# Patient Record
Sex: Female | Born: 1935 | Race: White | Hispanic: No | Marital: Married | Smoking: Former smoker
Health system: Southern US, Community
[De-identification: ages and names within clinical notes are randomized; demographics above are authoritative.]

## PROBLEM LIST (undated history)

## (undated) DIAGNOSIS — I272 Pulmonary hypertension, unspecified: Secondary | ICD-10-CM

## (undated) DIAGNOSIS — E119 Type 2 diabetes mellitus without complications: Secondary | ICD-10-CM

## (undated) DIAGNOSIS — J449 Chronic obstructive pulmonary disease, unspecified: Secondary | ICD-10-CM

## (undated) DIAGNOSIS — N3941 Urge incontinence: Secondary | ICD-10-CM

## (undated) DIAGNOSIS — R1310 Dysphagia, unspecified: Secondary | ICD-10-CM

## (undated) DIAGNOSIS — I480 Paroxysmal atrial fibrillation: Secondary | ICD-10-CM

## (undated) DIAGNOSIS — E785 Hyperlipidemia, unspecified: Secondary | ICD-10-CM

## (undated) DIAGNOSIS — F444 Conversion disorder with motor symptom or deficit: Secondary | ICD-10-CM

## (undated) DIAGNOSIS — G5600 Carpal tunnel syndrome, unspecified upper limb: Secondary | ICD-10-CM

## (undated) DIAGNOSIS — G2581 Restless legs syndrome: Secondary | ICD-10-CM

## (undated) DIAGNOSIS — M5416 Radiculopathy, lumbar region: Secondary | ICD-10-CM

## (undated) DIAGNOSIS — M8580 Other specified disorders of bone density and structure, unspecified site: Secondary | ICD-10-CM

## (undated) DIAGNOSIS — I10 Essential (primary) hypertension: Secondary | ICD-10-CM

## (undated) DIAGNOSIS — E1142 Type 2 diabetes mellitus with diabetic polyneuropathy: Secondary | ICD-10-CM

## (undated) HISTORY — DX: Chronic obstructive pulmonary disease, unspecified: J44.9

## (undated) HISTORY — PX: LUNG SURGERY: SHX703

## (undated) HISTORY — PX: CATARACT EXTRACTION: SUR2

## (undated) HISTORY — DX: Type 2 diabetes mellitus without complications: E11.9

## (undated) HISTORY — PX: CARPAL TUNNEL RELEASE: SHX101

## (undated) HISTORY — PX: FINGER SURGERY: SHX640

## (undated) HISTORY — DX: Restless legs syndrome: G25.81

## (undated) HISTORY — DX: Type 2 diabetes mellitus with diabetic polyneuropathy: E11.42

## (undated) HISTORY — DX: Essential (primary) hypertension: I10

## (undated) HISTORY — DX: Other specified disorders of bone density and structure, unspecified site: M85.80

## (undated) HISTORY — DX: Urge incontinence: N39.41

## (undated) HISTORY — DX: Pulmonary hypertension, unspecified: I27.20

## (undated) HISTORY — PX: HIP SURGERY: SHX245

## (undated) HISTORY — DX: Carpal tunnel syndrome, unspecified upper limb: G56.00

## (undated) HISTORY — DX: Hyperlipidemia, unspecified: E78.5

## (undated) HISTORY — DX: Radiculopathy, lumbar region: M54.16

## (undated) HISTORY — DX: Paroxysmal atrial fibrillation: I48.0

## (undated) HISTORY — DX: Dysphagia, unspecified: R13.10

## (undated) HISTORY — DX: Conversion disorder with motor symptom or deficit: F44.4

---

## 2018-04-01 ENCOUNTER — Encounter: Payer: Self-pay | Admitting: Neurology

## 2018-04-13 ENCOUNTER — Telehealth: Payer: Self-pay | Admitting: Neurology

## 2018-04-13 NOTE — Telephone Encounter (Signed)
Crystal Adams is a New Patient for Dr. Arbutus Leas this Monday. She fell  and broke a place on her foot, so she is in a shoe right now. Her daughter is wanting to know should she reschedule the appointment since she is having a hard time getting around. Please Advise. Thanks

## 2018-04-13 NOTE — Telephone Encounter (Signed)
Dr. Arbutus Leas can evaluate the tremor if they want to keep appointment. If she is in a lot of pain and wants to delay, that is fine as well. Thanks.

## 2018-04-16 ENCOUNTER — Encounter: Payer: Self-pay | Admitting: Neurology

## 2018-04-16 NOTE — Progress Notes (Deleted)
Crystal Adams was seen today in neurologic consultation at the request of Curt Bears, MD.  The consultation is for the evaluation of "tremor."  Records have been reviewed.  Patient symptoms started about ***.  Movements are described as whole body shaking episodes.  There is no associated loss of consciousness.  This could last up to 2 minutes.  She does not fall if it happens.  Several times per week she will have spontaneous arching of her lower back.  Patient has been on primidone.  She started on Cymbalta in 2018.  There was concern for psychogenic etiology.  The patient is a 84 y.o. year old female with a history of ***.  The first symptom began *** years ago.   Neuroimaging has *** previously been performed.  It *** available for my review today.  PREVIOUS MEDICATIONS: ***  ALLERGIES:   Allergies  Allergen Reactions  . Lisinopril Cough and Other (See Comments)    Cough   . Other Rash    HMG Co-A-R Inhibitor HMG Co-A-R Inhibitor statins   . Tramadol Itching    CURRENT MEDICATIONS:  Outpatient Encounter Medications as of 04/20/2018  Medication Sig  . acetaminophen (TYLENOL) 325 MG tablet Take by mouth.  Marland Kitchen albuterol (PROVENTIL HFA;VENTOLIN HFA) 108 (90 Base) MCG/ACT inhaler Inhale into the lungs.  Marland Kitchen amiodarone (PACERONE) 200 MG tablet TAKE 1 TABLET BY MOUTH EVERY DAY  . apixaban (ELIQUIS) 2.5 MG TABS tablet TAKE 1 TABLET BY MOUTH TWICE A DAY  . atorvastatin (LIPITOR) 10 MG tablet TAKE 1 TABLET BY MOUTH EVERY DAY  . diclofenac sodium (VOLTAREN) 1 % GEL Apply topically.  . docusate sodium (COLACE) 100 MG capsule Take by mouth.  . DULoxetine (CYMBALTA) 60 MG capsule TAKE ONE CAPSULE BY MOUTH EVERY DAY  . ezetimibe (ZETIA) 10 MG tablet TAKE 1 TABLET BY MOUTH EVERY DAY  . fenofibrate (TRICOR) 145 MG tablet TAKE ONE TABLET BY MOUTH ONCE DAILY  . fluticasone (FLONASE) 50 MCG/ACT nasal spray 2 sprays by Each Nare route daily as needed.  . furosemide (LASIX) 20 MG tablet Take by  mouth.  . gabapentin (NEURONTIN) 100 MG capsule TAKE 1 CAPSULE BY MOUTH TWICE A DAY  . Insulin Detemir (LEVEMIR FLEXTOUCH) 100 UNIT/ML Pen Inject into the skin.  Marland Kitchen levothyroxine (SYNTHROID, LEVOTHROID) 50 MCG tablet Take 1 tablet daily in the morning on an empty stomach  . loratadine (CLARITIN) 10 MG tablet Take by mouth.  Marland Kitchen LORazepam (ATIVAN) 0.5 MG tablet Take by mouth.  . magnesium oxide (MAG-OX) 400 MG tablet Take by mouth.  . metoprolol tartrate (LOPRESSOR) 25 MG tablet TAKE 1 TABLET BY MOUTH TWICE A DAY  . montelukast (SINGULAIR) 10 MG tablet Take by mouth.  . Multiple Vitamin (MULTI-VITAMINS) TABS Take by mouth.  Marland Kitchen omeprazole (PRILOSEC) 20 MG capsule Take by mouth.  . potassium chloride (K-DUR) 10 MEQ tablet TAKE 2 TABLETS IN THE MORNING AND 1 TABLET IN THE EVENING OR AS DIRECTED  . primidone (MYSOLINE) 50 MG tablet Take by mouth.  Marland Kitchen rOPINIRole (REQUIP) 1 MG tablet Take 1 tablet in the afternoon and take 2 tablets at bedtime  . tiZANidine (ZANAFLEX) 2 MG tablet Take 1-2 tabs twice daily as needed for pain, insomnia.   No facility-administered encounter medications on file as of 04/20/2018.     PAST MEDICAL HISTORY:   Past Medical History:  Diagnosis Date  . Carpal tunnel syndrome   . Chronic obstructive pulmonary disease (COPD) (HCC)   . Diabetic peripheral neuropathy (  HCC)   . Dysphagia   . Hyperlipidemia   . Hypertension   . Lumbar radiculopathy   . Osteopenia   . Paroxysmal atrial fibrillation (HCC)   . Psychogenic tremor   . Pulmonary hypertension (HCC)   . RLS (restless legs syndrome)   . Type 2 diabetes mellitus (HCC)   . Urinary incontinence, urge     PAST SURGICAL HISTORY:  *** The histories are not reviewed yet. Please review them in the "History" navigator section and refresh this SmartLink.  SOCIAL HISTORY:   Social History   Socioeconomic History  . Marital status: Not on file    Spouse name: Not on file  . Number of children: Not on file  . Years of  education: Not on file  . Highest education level: Not on file  Occupational History  . Not on file  Social Needs  . Financial resource strain: Not on file  . Food insecurity:    Worry: Not on file    Inability: Not on file  . Transportation needs:    Medical: Not on file    Non-medical: Not on file  Tobacco Use  . Smoking status: Former Smoker    Last attempt to quit: 05/22/2011    Years since quitting: 6.9  . Smokeless tobacco: Never Used  Substance and Sexual Activity  . Alcohol use: Not on file  . Drug use: Not on file  . Sexual activity: Not on file  Lifestyle  . Physical activity:    Days per week: Not on file    Minutes per session: Not on file  . Stress: Not on file  Relationships  . Social connections:    Talks on phone: Not on file    Gets together: Not on file    Attends religious service: Not on file    Active member of club or organization: Not on file    Attends meetings of clubs or organizations: Not on file    Relationship status: Not on file  . Intimate partner violence:    Fear of current or ex partner: Not on file    Emotionally abused: Not on file    Physically abused: Not on file    Forced sexual activity: Not on file  Other Topics Concern  . Not on file  Social History Narrative  . Not on file    FAMILY HISTORY:   No family status information on file.    ROS:  ROS  PHYSICAL EXAMINATION:    VITALS:  There were no vitals filed for this visit.  GEN:  Normal appears female in no acute distress.  Appears stated age. HEENT:  Normocephalic, atraumatic. The mucous membranes are moist. The superficial temporal arteries are without ropiness or tenderness. Cardiovascular: Regular rate and rhythm. Lungs: Clear to auscultation bilaterally. Neck/Heme: There are no carotid bruits noted bilaterally.  NEUROLOGICAL: Orientation:  The patient is alert and oriented x 3.  Fund of knowledge is appropriate.  Recent and remote memory intact.  Attention span  and concentration normal.  Repeats and names without difficulty. Cranial nerves: There is good facial symmetry. The pupils are equal round and reactive to light bilaterally. Fundoscopic exam reveals clear disc margins bilaterally. Extraocular muscles are intact and visual fields are full to confrontational testing. Speech is fluent and clear. Soft palate rises symmetrically and there is no tongue deviation. Hearing is intact to conversational tone. Tone: Tone is good throughout. Sensation: Sensation is intact to light touch and pinprick throughout (facial, trunk,  extremities). Vibration is intact at the bilateral big toe. There is no extinction with double simultaneous stimulation. There is no sensory dermatomal level identified. Coordination:  The patient has no difficulty with RAM's or FNF bilaterally. Motor: Strength is 5/5 in the bilateral upper and lower extremities.  Shoulder shrug is equal and symmetric. There is no pronator drift.  There are no fasciculations noted. DTR's: Deep tendon reflexes are 2/4 at the bilateral biceps, triceps, brachioradialis, patella and achilles.  Plantar responses are downgoing bilaterally. Gait and Station: The patient is able to ambulate without difficulty. The patient is able to heel toe walk without any difficulty. The patient is able to ambulate in a tandem fashion. The patient is able to stand in the Romberg position. Abnormal movements:  ***   IMPRESSION/PLAN  1. ***   -pt also has an evaluation scheduled with Dr. Arlana Pouch at [facility] in 11/2018.    Cc:  No primary care provider on file.

## 2018-04-20 ENCOUNTER — Ambulatory Visit: Payer: Self-pay | Admitting: Neurology

## 2018-05-11 NOTE — Progress Notes (Signed)
Crystal Adams was seen today in neurologic consultation at the request of Curt Bears, MD.  The consultation is for the evaluation of "tremor."  Records have been reviewed.  Patient symptoms started about 2-3 years ago but daughter thinks even longer.  Movements are described as whole body shaking episodes.  It can happen when seated, laying down or standing.  If standing, "I have to hold onto something."  She doesn't know what brings it on but she can feel a "little shake" before the "whole body shake" comes on.  It can last all day but if you "pinch me" it will stop.  Her daughter states that works most of the time.  The PT told her that would "trick her brain."   The PT also told her he could not help her any longer.   There is no associated loss of consciousness.  She can speak during a spell.   Several times per week she will have spontaneous arching of her lower back with tremor when she arises.  Patient has been on primidone.  She is also on gabapentin to see if that helps.   There was concern for psychogenic etiology.  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     ALLERGIES:   Allergies  Allergen Reactions  . Lisinopril Cough and Other (See Comments)    Cough   . Other Rash    HMG Co-A-R Inhibitor HMG Co-A-R Inhibitor statins   . Tramadol Itching    CURRENT MEDICATIONS:  Outpatient Encounter Medications as of 05/18/2018  Medication Sig  . acetaminophen (TYLENOL) 325 MG tablet Take by mouth.  Marland Kitchen albuterol (PROVENTIL HFA;VENTOLIN HFA) 108 (90 Base) MCG/ACT inhaler Inhale into the lungs.  Marland Kitchen amiodarone (PACERONE) 200 MG tablet TAKE 1 TABLET BY MOUTH EVERY DAY  . apixaban (ELIQUIS) 2.5 MG TABS tablet TAKE 1 TABLET BY MOUTH TWICE A DAY  . atorvastatin (LIPITOR) 10 MG tablet TAKE 1 TABLET BY MOUTH EVERY DAY  . diclofenac sodium (VOLTAREN) 1  % GEL Apply topically.  . docusate sodium (COLACE) 100 MG capsule Take by mouth.  . DULoxetine (CYMBALTA) 60 MG capsule TAKE ONE CAPSULE BY MOUTH EVERY DAY  . ezetimibe (ZETIA) 10 MG tablet TAKE 1 TABLET BY MOUTH EVERY DAY  . fenofibrate (TRICOR) 145 MG tablet TAKE ONE TABLET BY MOUTH ONCE DAILY  . fluticasone (FLONASE) 50 MCG/ACT nasal spray 2 sprays by Each Nare route daily as needed.  . furosemide (LASIX) 20 MG tablet Take 20 mg by mouth daily.   Marland Kitchen gabapentin (NEURONTIN) 100 MG capsule 3 at bedtime  . Insulin Detemir (LEVEMIR FLEXTOUCH) 100 UNIT/ML Pen Inject into the skin.  Marland Kitchen levothyroxine (SYNTHROID, LEVOTHROID) 50 MCG tablet Take 1 tablet daily in the morning on an empty stomach  . loratadine (CLARITIN) 10 MG tablet Take by mouth.  Marland Kitchen LORazepam (ATIVAN) 0.5 MG tablet Take by mouth.  . magnesium oxide (MAG-OX) 400 MG tablet Take by mouth.  . metolazone (ZAROXOLYN) 2.5 MG tablet Take 2.5 mg by mouth daily.  . metoprolol tartrate (LOPRESSOR) 25 MG tablet TAKE 1 TABLET BY MOUTH TWICE A DAY  . Multiple Vitamin (MULTI-VITAMINS) TABS Take by mouth.  Marland Kitchen omeprazole (PRILOSEC) 20 MG capsule Take by mouth.  . potassium chloride (K-DUR) 10 MEQ tablet TAKE 2 TABLETS IN THE MORNING AND 1 TABLET  IN THE EVENING OR AS DIRECTED  . primidone (MYSOLINE) 50 MG tablet Take 50 mg by mouth 2 (two) times daily.   Marland Kitchen rOPINIRole (REQUIP) 1 MG tablet Take 1 tablet in the afternoon and take 2 tablets at bedtime  . tiZANidine (ZANAFLEX) 2 MG tablet Take 1-2 tabs twice daily as needed for pain, insomnia.  . [DISCONTINUED] montelukast (SINGULAIR) 10 MG tablet Take by mouth.   No facility-administered encounter medications on file as of 05/18/2018.     PAST MEDICAL HISTORY:   Past Medical History:  Diagnosis Date  . Carpal tunnel syndrome   . Chronic obstructive pulmonary disease (COPD) (HCC)   . Diabetic peripheral neuropathy (HCC)   . Dysphagia   . Hyperlipidemia   . Hypertension   . Lumbar radiculopathy   .  Osteopenia   . Paroxysmal atrial fibrillation (HCC)   . Psychogenic tremor   . Pulmonary hypertension (HCC)   . RLS (restless legs syndrome)   . Type 2 diabetes mellitus (HCC)   . Urinary incontinence, urge     PAST SURGICAL HISTORY:   Past Surgical History:  Procedure Laterality Date  . CARPAL TUNNEL RELEASE Right   . CATARACT EXTRACTION Bilateral   . FINGER SURGERY  left   multiple  . HIP SURGERY Right   . LUNG SURGERY  2019, in her 15's    SOCIAL HISTORY:   Social History   Socioeconomic History  . Marital status: Married    Spouse name: Not on file  . Number of children: Not on file  . Years of education: Not on file  . Highest education level: Not on file  Occupational History  . 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 Financial resource strain: Not on file  . Food insecurity:    Worry: Not on file    Inability: Not on file  . Transportation needs:    Medical: Not on file    Non-medical: Not on file  Tobacco Use  . Smoking status: Former Smoker    Last attempt to quit: 05/22/2011    Years since quitting: 6.9  . Smokeless tobacco: Never Used  Substance and Sexual Activity  . Alcohol use: Not Currently    Frequency: Never  . Drug use: Never  . Sexual activity: Not on file  Lifestyle  . Physical activity:    Days per week: Not on file    Minutes per session: Not on file  . Stress: Not on file  Relationships  . Social connections:    Talks on phone: Not on file    Gets together: Not on file    Attends religious service: Not on file    Active member of club or organization: Not on file    Attends meetings of clubs or organizations: Not on file    Relationship status: Not on file  . Intimate partner violence:    Fear of current or ex partner: Not on file    Emotionally abused: Not on file    Physically abused: Not on file    Forced sexual activity: Not on file  Other Topics Concern  . Not on file   Social History Narrative  . Not on file    FAMILY HISTORY:   Family Status  Relation Name Status  . Mother  Deceased  . Father  Deceased  . Brother x5 Deceased  . Daughter x2 Alive  . Son 1 Alive  ROS:  Review of Systems  Constitutional: Positive for malaise/fatigue.  Eyes: Negative.   Respiratory: Positive for shortness of breath (intermittent, not currently, associated with COPD).   Gastrointestinal: Negative.   Genitourinary: Negative.   Skin: Negative.   Neurological: Positive for weakness.  Endo/Heme/Allergies: Negative.   Psychiatric/Behavioral: Positive for memory loss (per patient; daughter does meds and bill).    PHYSICAL EXAMINATION:    VITALS:   Vitals:   05/18/18 0923  BP: (!) 144/64  Pulse: 64  SpO2: 92%  Weight: 200 lb (90.7 kg)  Height: 5\' 6"  (1.676 m)    GEN:  Normal appears female in no acute distress.  Appears stated age. HEENT:  Normocephalic, atraumatic. The mucous membranes are moist. The superficial temporal arteries are without ropiness or tenderness. Cardiovascular: Regular rate and rhythm. Lungs: Clear to auscultation bilaterally. Neck/Heme: There are no carotid bruits noted bilaterally.  NEUROLOGICAL: Orientation:  The patient is alert and oriented x 3.  Fund of knowledge is appropriate.  Recent and remote memory intact.  Attention span and concentration normal.  Repeats and names without difficulty. Cranial nerves: There is good facial symmetry. The pupils are equal round and reactive to light bilaterally. Fundoscopic exam reveals clear disc margins bilaterally. Extraocular muscles are intact and visual fields are full to confrontational testing. Speech is fluent and clear. Soft palate rises symmetrically and there is no tongue deviation. Hearing is intact to conversational tone. Tone: Tone is good throughout. Sensation: Sensation is intact to light touch and pinprick throughout (facial, trunk, extremities). Vibration is decreased at the  bilateral big toe. There is no extinction with double simultaneous stimulation. There is no sensory dermatomal level identified. Coordination:  The patient has no difficulty with RAM's or FNF bilaterally. Motor: Strength is 5/5 in the bilateral upper and lower extremities (there is some give way weakness in the LLE that improves with encouragement - give way weakness is due to LBP).  Shoulder shrug is equal and symmetric. There is no pronator drift.  There are no fasciculations noted. DTR's: Deep tendon reflexes are 0-1/4 at the bilateral biceps, triceps, brachioradialis, patella and achilles.  Plantar responses are downgoing bilaterally. Abnormal movements:  twice during the visit, the patient begins to have a violent head shake in the no direction and she will stop and pinch herself and then the movements will stop.  2 other times, when standing/walking, she has a high amplitude tremor in the legs and arching of the back associated with back pain.     IMPRESSION/PLAN  1. Psychogenic tremor/ psychogenic seizure  -I was able to witness several episodes during the visit and felt pretty confident that these are psychogenic in nature.  The patient was able to pinch herself and stop the violent head tremor in the "no" direction.  She and her daughter both confirm that this was a typical spell and that pinching herself generally works.  Patient and I discussed nature and pathophysiology of symptoms.  We discussed that this diagnosis does not mean that one "made up" the symptoms, but rather that the symptoms are derived from stress.  Discussed with the patient that his type of movement is not generally effectively treated with medication.  Discussed with the patient that treatment involves counseling to help when understand the source of the stress.  Discussed that it could, and generally does, take quite some time for counseling to help resolve the symptoms.    -I did discuss with them that I would go ahead  and do  an EEG just to make sure that this was not anything else.  I feel confident we can capture a spell.  -pt is currently on primidone and eliquis.  Explained to the patient that these medications interact and one should not be on these medications together.  I would recommend she d/c the primidone.  She is currently on primidone, 50 mg twice per day.  I would decrease to 50 mg once per day for 1 week and then stop the medication.  I do not think that the medication is likely useful anyway.  -Patient's movements were not consistent with orthostatic tremor, which generally is a small amplitude tremor and also would go away with movement of the legs.  In addition, this was not just a lower extremity tremor.  Finally, her movements were but not just when standing, but also seated.  2.   Peripheral neuropathy  -The patient has clinical examination evidence of a diffuse peripheral neuropathy, which is likely due to diabetes.  We discussed safety associated with peripheral neuropathy.  We discussed balance therapy and the importance of ambulatory assistive device for balance assistance.  She is using a walker.  3.  Much greater than 50% of this visit was spent in counseling and coordinating care.  Total face to face time:  60 min     Cc:  Brooke Bonito, MD

## 2018-05-18 ENCOUNTER — Encounter: Payer: Self-pay | Admitting: Neurology

## 2018-05-18 ENCOUNTER — Ambulatory Visit: Payer: Medicare HMO | Admitting: Neurology

## 2018-05-18 VITALS — BP 144/64 | HR 64 | Ht 66.0 in | Wt 200.0 lb

## 2018-05-18 DIAGNOSIS — R251 Tremor, unspecified: Secondary | ICD-10-CM

## 2018-05-18 DIAGNOSIS — E1142 Type 2 diabetes mellitus with diabetic polyneuropathy: Secondary | ICD-10-CM

## 2018-05-18 DIAGNOSIS — F444 Conversion disorder with motor symptom or deficit: Secondary | ICD-10-CM

## 2018-05-18 NOTE — Patient Instructions (Signed)
1. Decrease Primidone to once daily for one week, then stop.   2. Will schedule EEG, if normal we will schedule 48 hour EEG.

## 2018-05-21 ENCOUNTER — Other Ambulatory Visit: Payer: Medicare HMO

## 2018-05-27 ENCOUNTER — Ambulatory Visit (INDEPENDENT_AMBULATORY_CARE_PROVIDER_SITE_OTHER): Payer: Medicare HMO | Admitting: Neurology

## 2018-05-27 DIAGNOSIS — R251 Tremor, unspecified: Secondary | ICD-10-CM | POA: Diagnosis not present

## 2018-05-28 ENCOUNTER — Telehealth: Payer: Self-pay | Admitting: Neurology

## 2018-05-28 NOTE — Telephone Encounter (Signed)
Patients daughter made aware

## 2018-05-28 NOTE — Telephone Encounter (Signed)
-----   Message from Octaviano Battyebecca S Tat, DO sent at 05/28/2018 10:44 AM EST ----- Lesly RubensteinJade, please give patient/daughter results.  When pt was here yesterday she asked results be called to daughter.  Pt had several typical spells that were unaccompanied by changes in brain waves.  As I told them in the visit, I believe that these episodes are associated with stress.  No epilepsy.

## 2018-05-28 NOTE — Procedures (Signed)
TECHNICAL SUMMARY:  A multichannel referential and bipolar montage EEG using the standard international 10-20 system was performed on the patient described as awake and drowsy.  The dominant background activity consists of 9 hertz activity seen most prominantly over the posterior head region.  The backgound activity is reactive to eye opening and closing procedures.  Low voltage fast (beta) activity is distributed symmetrically and maximally over the anterior head regions.  ACTIVATION:  Stepwise photic stimulation at 4-20 flashes per second was performed.  The patient did have a typical spell during photic stimulation and, with the exception of artifactual background activity, there was no change in the background activity.  Hyperventilation was not performed.  EPILEPTIFORM ACTIVITY:  There were no spikes, sharp waves or paroxysmal activity.  The patient had multiple typical spells during the recording, consisting of shaking her head, grunting and jerking.  None of these spells were associated with any epileptiform activity.  SLEEP: Brief physiologic drowsiness is noted, but no stage II sleep.  CARDIAC:  The EKG lead revealed a regular rhythm at 60 bpm.   IMPRESSION:  This EEG demonstrated no focal, hemispheric, or lateralizing features.  The background rhythm was normal.  As above, the patient had multiple typical episodes of shaking and jerking.  These were unaccompanied by changes in the background activity.  No epileptiform activity was recorded.

## 2018-06-02 ENCOUNTER — Telehealth: Payer: Self-pay | Admitting: Neurology

## 2018-06-02 NOTE — Telephone Encounter (Signed)
Patient's daughter called regarding her mother's test results. She would like you to please call her. She has some questions. She said she was at work when she got the phone call last week and couldn't really ask any questions. Please Call. Thanks

## 2018-06-02 NOTE — Telephone Encounter (Signed)
Discussed psychogenic tremor/seizure with daughter again. Will call with any other questions.

## 2020-04-07 ENCOUNTER — Emergency Department (HOSPITAL_BASED_OUTPATIENT_CLINIC_OR_DEPARTMENT_OTHER): Payer: Medicare HMO

## 2020-04-07 ENCOUNTER — Emergency Department (HOSPITAL_BASED_OUTPATIENT_CLINIC_OR_DEPARTMENT_OTHER)
Admission: EM | Admit: 2020-04-07 | Discharge: 2020-04-07 | Disposition: A | Discharge status: Home / Self Care | Payer: Medicare HMO | Attending: Emergency Medicine | Admitting: Emergency Medicine

## 2020-04-07 ENCOUNTER — Encounter (HOSPITAL_BASED_OUTPATIENT_CLINIC_OR_DEPARTMENT_OTHER): Payer: Self-pay

## 2020-04-07 ENCOUNTER — Other Ambulatory Visit: Payer: Self-pay

## 2020-04-07 DIAGNOSIS — Z7951 Long term (current) use of inhaled steroids: Secondary | ICD-10-CM | POA: Diagnosis not present

## 2020-04-07 DIAGNOSIS — Z79899 Other long term (current) drug therapy: Secondary | ICD-10-CM | POA: Diagnosis not present

## 2020-04-07 DIAGNOSIS — I272 Pulmonary hypertension, unspecified: Secondary | ICD-10-CM | POA: Insufficient documentation

## 2020-04-07 DIAGNOSIS — Z7901 Long term (current) use of anticoagulants: Secondary | ICD-10-CM | POA: Diagnosis not present

## 2020-04-07 DIAGNOSIS — R0789 Other chest pain: Secondary | ICD-10-CM

## 2020-04-07 DIAGNOSIS — Z8616 Personal history of COVID-19: Secondary | ICD-10-CM | POA: Insufficient documentation

## 2020-04-07 DIAGNOSIS — I480 Paroxysmal atrial fibrillation: Secondary | ICD-10-CM | POA: Insufficient documentation

## 2020-04-07 DIAGNOSIS — Z794 Long term (current) use of insulin: Secondary | ICD-10-CM | POA: Insufficient documentation

## 2020-04-07 DIAGNOSIS — Z87891 Personal history of nicotine dependence: Secondary | ICD-10-CM | POA: Insufficient documentation

## 2020-04-07 DIAGNOSIS — J449 Chronic obstructive pulmonary disease, unspecified: Secondary | ICD-10-CM | POA: Insufficient documentation

## 2020-04-07 DIAGNOSIS — R059 Cough, unspecified: Secondary | ICD-10-CM | POA: Diagnosis not present

## 2020-04-07 DIAGNOSIS — E119 Type 2 diabetes mellitus without complications: Secondary | ICD-10-CM | POA: Insufficient documentation

## 2020-04-07 LAB — CBC
HCT: 42.3 % (ref 36.0–46.0)
Hemoglobin: 12.8 g/dL (ref 12.0–15.0)
MCH: 27.7 pg (ref 26.0–34.0)
MCHC: 30.3 g/dL (ref 30.0–36.0)
MCV: 91.6 fL (ref 80.0–100.0)
Platelets: 263 10*3/uL (ref 150–400)
RBC: 4.62 MIL/uL (ref 3.87–5.11)
RDW: 17.5 % — ABNORMAL HIGH (ref 11.5–15.5)
WBC: 12.2 10*3/uL — ABNORMAL HIGH (ref 4.0–10.5)
nRBC: 0 % (ref 0.0–0.2)

## 2020-04-07 LAB — LIPASE, BLOOD: Lipase: 23 U/L (ref 11–51)

## 2020-04-07 LAB — BASIC METABOLIC PANEL
Anion gap: 13 (ref 5–15)
BUN: 32 mg/dL — ABNORMAL HIGH (ref 8–23)
CO2: 32 mmol/L (ref 22–32)
Calcium: 9 mg/dL (ref 8.9–10.3)
Chloride: 89 mmol/L — ABNORMAL LOW (ref 98–111)
Creatinine, Ser: 1.23 mg/dL — ABNORMAL HIGH (ref 0.44–1.00)
GFR, Estimated: 43 mL/min — ABNORMAL LOW (ref >60)
Glucose, Bld: 123 mg/dL — ABNORMAL HIGH (ref 70–99)
Potassium: 4.5 mmol/L (ref 3.5–5.1)
Sodium: 134 mmol/L — ABNORMAL LOW (ref 135–145)

## 2020-04-07 LAB — HEPATIC FUNCTION PANEL
ALT: 34 U/L (ref 0–44)
AST: 33 U/L (ref 15–41)
Albumin: 3.2 g/dL — ABNORMAL LOW (ref 3.5–5.0)
Alkaline Phosphatase: 112 U/L (ref 38–126)
Bilirubin, Direct: 0.2 mg/dL (ref 0.0–0.2)
Indirect Bilirubin: 0.9 mg/dL (ref 0.3–0.9)
Total Bilirubin: 1.1 mg/dL (ref 0.3–1.2)
Total Protein: 7.5 g/dL (ref 6.5–8.1)

## 2020-04-07 LAB — BRAIN NATRIURETIC PEPTIDE: B Natriuretic Peptide: 114.7 pg/mL — ABNORMAL HIGH (ref 0.0–100.0)

## 2020-04-07 LAB — TROPONIN I (HIGH SENSITIVITY)
Troponin I (High Sensitivity): 9 ng/L (ref <18)
Troponin I (High Sensitivity): 13 ng/L (ref <18)

## 2020-04-07 MED ORDER — IOHEXOL 350 MG/ML SOLN
75.0000 mL | Freq: Once | INTRAVENOUS | Status: AC | PRN
  Administered 2020-04-07: 75 mL via INTRAVENOUS

## 2020-04-07 MED ORDER — LIDOCAINE 5 % EX PTCH: 1.0000 | MEDICATED_PATCH | CUTANEOUS | 0 refills | Status: AC

## 2020-04-07 MED ORDER — PREDNISONE 20 MG PO TABS: 40.0000 mg | ORAL_TABLET | Freq: Every day | ORAL | 0 refills | Status: AC

## 2020-04-07 MED ORDER — LIDOCAINE 5 % EX PTCH
1.0000 | MEDICATED_PATCH | CUTANEOUS | Status: DC
  Administered 2020-04-07: 1 via TRANSDERMAL
  Filled 2020-04-07: qty 1

## 2020-04-07 NOTE — ED Notes (Signed)
Initial EKG did not cross into Epic-EDP states hardcopy with artifact-EKG redone

## 2020-04-07 NOTE — Discharge Instructions (Addendum)
I have given you a prescription for steroids today.  Some common side effects include feelings of extra energy, feeling warm, increased appetite, and stomach upset.  If you are diabetic your sugars may run higher than usual.   Please make sure you are drinking water to help your kidneys flush out the contrast dye.   If your chest pain changes or worsens, you develop fevers, require more oxygen, or have any significant leg swelling, new complaints or concerns please seek additional medical care and evaluation.  Please follow-up with your primary care doctor.

## 2020-04-07 NOTE — ED Triage Notes (Signed)
Pt c/o left chest/breast pain, LUQ pain x 4 days-NAD-to triage with home O2-daughter states pt dx with covid 9/22 with admn to [facility] 9/23-NAD-steady gait

## 2020-04-07 NOTE — ED Provider Notes (Signed)
[facility] EMERGENCY DEPARTMENT Provider Note   CSN: [id number] Arrival date & time: 04/07/20  1515     History Chief Complaint  Patient presents with  . Chest Pain    Crystal Adams is a 86 y.o. female.  With a past medical history of COPD, DM, hypertension, hyperlipidemia, A. fib anticoagulated with Eliquis, who presents today for evaluation of left-sided chest/breast pain, with worsening cough.  She was diagnosed with Covid on 9/22, since then has been home and on oxygen however over the past 4 days has had worsening cough and pain in the left side of her chest.  Daughter reports that patient is compliant with all of her medications.  She denies any new or worsened significant shortness of breath.  No pain in her arm.  The pain does not radiate or move.  It is made worse with movement.  No alleviating factors noted.  No abnormal leg swelling.  HPI     Past Medical History:  Diagnosis Date  . Carpal tunnel syndrome   . Chronic obstructive pulmonary disease (COPD) (HCC)   . Diabetic peripheral neuropathy (HCC)   . Dysphagia   . Hyperlipidemia   . Hypertension   . Lumbar radiculopathy   . Osteopenia   . Paroxysmal atrial fibrillation (HCC)   . Psychogenic tremor   . Pulmonary hypertension (HCC)   . RLS (restless legs syndrome)   . Type 2 diabetes mellitus (HCC)   . Urinary incontinence, urge     There are no problems to display for this patient.   Past Surgical History:  Procedure Laterality Date  . CARPAL TUNNEL RELEASE Right   . CATARACT EXTRACTION Bilateral   . FINGER SURGERY  left   multiple  . HIP SURGERY Right   . LUNG SURGERY  2019, in her 30's     OB History   No obstetric history on file.     Family History  Problem Relation Age of Onset  . Diabetes Mother   . Diverticulitis Mother   . Heart attack Father   . Bone cancer Brother   . Tremor Brother   . Colon cancer Brother     Social History   Tobacco Use  . Smoking status: Former  Smoker    Quit date: 05/22/2011    Years since quitting: 8.8  . Smokeless tobacco: Never Used  Vaping Use  . Vaping Use: Never used  Substance Use Topics  . Alcohol use: Not Currently  . Drug use: Never    Home Medications Prior to Admission medications   Medication Sig Start Date End Date Taking? Authorizing Provider  acetaminophen (TYLENOL) 325 MG tablet Take by mouth.    [provider]  albuterol (PROVENTIL HFA;VENTOLIN HFA) 108 (90 Base) MCG/ACT inhaler Inhale into the lungs.    [provider]  amiodarone (PACERONE) 200 MG tablet TAKE 1 TABLET BY MOUTH EVERY DAY 01/20/18   [provider]  apixaban (ELIQUIS) 2.5 MG TABS tablet TAKE 1 TABLET BY MOUTH TWICE A DAY 09/09/17   [provider]  atorvastatin (LIPITOR) 10 MG tablet TAKE 1 TABLET BY MOUTH EVERY DAY 09/09/17   [provider]  diclofenac sodium (VOLTAREN) 1 % GEL Apply topically. 02/10/18   [provider]  docusate sodium (COLACE) 100 MG capsule Take by mouth.    [provider]  DULoxetine (CYMBALTA) 60 MG capsule TAKE ONE CAPSULE BY MOUTH EVERY DAY 02/04/18   [provider]  ezetimibe (ZETIA) 10 MG tablet  TAKE 1 TABLET BY MOUTH EVERY DAY 12/15/17   [provider]  fenofibrate (TRICOR) 145 MG tablet TAKE ONE TABLET BY MOUTH ONCE DAILY 09/29/17   [provider]  fluticasone (FLONASE) 50 MCG/ACT nasal spray 2 sprays by Each Nare route daily as needed.    [provider]  furosemide (LASIX) 20 MG tablet Take 20 mg by mouth daily.  04/14/18   [provider]  gabapentin (NEURONTIN) 100 MG capsule 3 at bedtime 04/10/16   [provider]  Insulin Detemir (LEVEMIR FLEXTOUCH) 100 UNIT/ML Pen Inject into the skin. 03/10/18   [provider]  levothyroxine (SYNTHROID, LEVOTHROID) 50 MCG tablet Take 1 tablet daily in the morning on an empty stomach 03/25/18   [provider]  lidocaine (LIDODERM) 5 % Place 1  patch onto the skin daily. Remove & Discard patch within 12 hours, then wait 12 hours before applying a new patch 04/07/20   Cristina Gong, PA-C  loratadine (CLARITIN) 10 MG tablet Take by mouth.    [provider]  LORazepam (ATIVAN) 0.5 MG tablet Take by mouth. 07/29/17   [provider]  magnesium oxide (MAG-OX) 400 MG tablet Take by mouth. 07/29/17   [provider]  metolazone (ZAROXOLYN) 2.5 MG tablet Take 2.5 mg by mouth daily. 03/03/18   [provider]  metoprolol tartrate (LOPRESSOR) 25 MG tablet TAKE 1 TABLET BY MOUTH TWICE A DAY 09/24/17   [provider]  Multiple Vitamin (MULTI-VITAMINS) TABS Take by mouth.    [provider]  omeprazole (PRILOSEC) 20 MG capsule Take by mouth. 09/12/17   [provider]  potassium chloride (K-DUR) 10 MEQ tablet TAKE 2 TABLETS IN THE MORNING AND 1 TABLET IN THE EVENING OR AS DIRECTED 01/21/18   [provider]  predniSONE (DELTASONE) 20 MG tablet Take 2 tablets (40 mg total) by mouth daily for 5 days. 04/07/20 04/12/20  Cristina Gong, PA-C  primidone (MYSOLINE) 50 MG tablet Take 50 mg by mouth 2 (two) times daily.  03/31/18   [provider]  rOPINIRole (REQUIP) 1 MG tablet Take 1 tablet in the afternoon and take 2 tablets at bedtime 10/17/17   [provider]  tiZANidine (ZANAFLEX) 2 MG tablet Take 1-2 tabs twice daily as needed for pain, insomnia. 03/30/18   [provider]    Allergies    Lisinopril, Other, and Tramadol  Review of Systems   Review of Systems  Constitutional: Negative for chills and fever.  HENT: Negative for congestion.   Eyes: Negative for visual disturbance.  Respiratory: Positive for cough. Negative for chest tightness and shortness of breath (Unchanged from baseline).   Cardiovascular: Positive for chest pain. Negative for palpitations and leg swelling.  Gastrointestinal: Negative for abdominal pain, nausea and  vomiting.  Genitourinary: Negative for dysuria.  Musculoskeletal: Negative for back pain.  Skin: Negative for color change and rash.  Neurological: Negative for dizziness, weakness (No focal) and headaches.  Psychiatric/Behavioral: Negative for confusion.  All other systems reviewed and are negative.   Physical Exam Updated Vital Signs BP (!) 102/49 (BP Location: Right Arm)   Pulse 82   Temp 98.8 F (37.1 C) (Oral)   Resp 19   SpO2 92%   Physical Exam Vitals and nursing note reviewed.  Constitutional:      General: She is not in acute distress.    Appearance: She is well-developed.  HENT:     Head: Normocephalic and atraumatic.  Eyes:  Conjunctiva/sclera: Conjunctivae normal.  Neck:     Vascular: No JVD.  Cardiovascular:     Rate and Rhythm: Normal rate and regular rhythm.     Pulses:          Radial pulses are 2+ on the right side and 2+ on the left side.       Posterior tibial pulses are 2+ on the right side and 2+ on the left side.     Heart sounds: Normal heart sounds. No murmur heard.   Pulmonary:     Effort: Pulmonary effort is normal. No accessory muscle usage or respiratory distress.     Breath sounds: Examination of the right-lower field reveals rhonchi. Examination of the left-lower field reveals rhonchi. Rhonchi present.  Chest:     Chest wall: Tenderness (Palpation over left lateral chest increases and exacerbates her reported pain.) present. No deformity or crepitus.  Abdominal:     Palpations: Abdomen is soft.     Tenderness: There is no abdominal tenderness.  Musculoskeletal:     Cervical back: Neck supple.     Right lower leg: No tenderness. No edema.     Left lower leg: No tenderness. No edema.  Skin:    General: Skin is warm and dry.  Neurological:     Mental Status: She is alert.     Comments: Patient is awake and alert, answers questions appropriately.  Psychiatric:        Mood and Affect: Mood normal.        Behavior: Behavior normal.      ED Results / Procedures / Treatments   Labs (all labs ordered are listed, but only abnormal results are displayed) Labs Reviewed  BASIC METABOLIC PANEL - Abnormal; Notable for the following components:      Result Value   Sodium 134 (*)    Chloride 89 (*)    Glucose, Bld 123 (*)    BUN 32 (*)    Creatinine, Ser 1.23 (*)    GFR, Estimated 43 (*)    All other components within normal limits  CBC - Abnormal; Notable for the following components:   WBC 12.2 (*)    RDW 17.5 (*)    All other components within normal limits  BRAIN NATRIURETIC PEPTIDE - Abnormal; Notable for the following components:   B Natriuretic Peptide 114.7 (*)    All other components within normal limits  HEPATIC FUNCTION PANEL - Abnormal; Notable for the following components:   Albumin 3.2 (*)    All other components within normal limits  LIPASE, BLOOD  TROPONIN I (HIGH SENSITIVITY)  TROPONIN I (HIGH SENSITIVITY)  TROPONIN I (HIGH SENSITIVITY)  TROPONIN I (HIGH SENSITIVITY)    EKG EKG Interpretation  Date/Time:  Friday April 07 2020 15:56:56 EDT Ventricular Rate:  77 PR Interval:  204 QRS Duration: 66 QT Interval:  390 QTC Calculation: 441 R Axis:   81 Text Interpretation: Normal sinus rhythm Low voltage QRS Nonspecific ST abnormality Abnormal ECG No prior ECG for comparison. No STEMI Confirmed by Theda Belfast ([id number]) on 04/07/2020 6:45:05 PM   Radiology CT Angio Chest PE W and/or Wo Contrast  Result Date: 04/07/2020 CLINICAL DATA:  Left chest pain EXAM: CT ANGIOGRAPHY CHEST WITH CONTRAST TECHNIQUE: Multidetector CT imaging of the chest was performed using the standard protocol during bolus administration of intravenous contrast. Multiplanar CT image reconstructions and MIPs were obtained to evaluate the vascular anatomy. CONTRAST:  50mL OMNIPAQUE IOHEXOL 350 MG/ML SOLN COMPARISON:  06/25/2017 FINDINGS: Cardiovascular: Severe  diffuse coronary artery disease and aortic atherosclerosis.  Cardiomegaly. No evidence of aortic aneurysm. Retroesophageal right subclavian artery. No filling defects in the pulmonary arteries to suggest pulmonary emboli. Mediastinum/Nodes: No mediastinal, hilar, or axillary adenopathy. Trachea and esophagus are unremarkable. Thyroid unremarkable. Lungs/Pleura: Severe bullous emphysema. Large bulla noted in the anteromedial left upper lobe. Biapical scarring. Areas of scarring throughout the lungs bilaterally. Bronchiectasis in the left lower lobe. No definite acute process. No effusions. Upper Abdomen: Imaging into the upper abdomen demonstrates no acute findings. Musculoskeletal: Chest wall soft tissues are unremarkable. No acute bony abnormality. Review of the MIP images confirms the above findings. IMPRESSION: Severe bullous emphysematous changes and areas of scarring throughout the lungs, most pronounced in the apices. No definite acute process. No evidence of pulmonary embolus. Diffuse coronary artery disease. Aortic Atherosclerosis (ICD10-I70.0) and Emphysema (ICD10-J43.9). Electronically Signed   By: Charlett Nose M.D.   On: 04/07/2020 20:56   DG Chest Portable 1 View  Result Date: 04/07/2020 CLINICAL DATA:  Prior COVID pneumonia, chest pain EXAM: PORTABLE CHEST 1 VIEW COMPARISON:  03/09/2020 FINDINGS: Extensive scarring within the a right apex appears stable from multiple prior examinations may represent the chronic sequela of remote granulomatous infection. Mild left pleuroparenchymal scarring again noted. Peripheral pulmonary infiltrates have developed within the left mid and lower lung zone and right lung base, likely infectious or inflammatory in nature. No pleural effusion. No pneumothorax. Cardiac size within normal limits. Superior traction of the right hilum is unchanged. Pulmonary vascularity is normal. No acute bone abnormality. IMPRESSION: Interval development of multifocal pulmonary infiltrates, likely infectious or inflammatory. Stable scarring  within the right apex, possibly the sequela of remote granulomatous infection. Electronically Signed   By: Helyn Numbers MD   On: 04/07/2020 16:05    Procedures Procedures (including critical care time)  Medications Ordered in ED Medications  lidocaine (LIDODERM) 5 % 1 patch (1 patch Transdermal Patch Applied 04/07/20 2315)  iohexol (OMNIPAQUE) 350 MG/ML injection 75 mL (75 mLs Intravenous Contrast Given 04/07/20 2039)    ED Course  I have reviewed the triage vital signs and the nursing notes.  Pertinent labs & imaging results that were available during my care of the patient were reviewed by me and considered in my medical decision making (see chart for details).    MDM Rules/Calculators/A&P                         Patient is an 86 year old woman, approximately 1 month status post Covid diagnosis who presents today for evaluation of left-sided chest pain worsening over the past few days.  On exam she does have some mild tenderness to palpation in the left side of her chest.  Chest x-ray was obtained showing development of multifocal infiltrates, this would be consistent with her history of COVID-19, however given her recent chest pain and cough concern for underlying etiology other than Covid.  She reports compliance with her anticoagulation however given her recent Covid diagnosis, hospitalization she would be at an increased risk of PE.  She is 92 to 93% on her 2 L of oxygen which is her baseline according to her daughter.  CBC shows mild leukocytosis at 12.2.  BMP appears consistent with baseline with slightly elevated creatinine.  Hepatic function panel is normal.  BNP is minimally elevated at 114.  Troponin x2 is not significantly elevated.  Suspect a COPD exacerbation.  CT T a PE study does not show PE or evidence of an  acute process.  Given that clinically she appears to have a COPD exacerbation, we discussed options for treatment and disposition.  Given that patient recently had a  negative effect to Levaquin and just finished Levaquin a few weeks ago they do not wish for antibiotics at this time.  Instead we will treat her with a prednisone burst.  Recommended using inhalers at home as needed.  Patient is not requiring additional oxygen, is afebrile not tachycardic or tachypneic and is in no obvious distress.  Given patient's age and comorbidities admission was discussed, however they wish to go home.  This patient was seen as a shared visit with Dr. Rush Landmark.  Additionally we will treat with lidocaine patch.  Given negative troponin x2 lower suspicion for ACS, she does have multiple risk factors including CT scan showing coronary artery disease however again patient wishes to go home with outpatient follow-up.  JASILYN HOLDERMAN was evaluated in Emergency Department on 04/08/2020 for the symptoms described in the history of present illness. She was evaluated in the context of the global COVID-19 pandemic, which necessitated consideration that the patient might be at risk for infection with the SARS-CoV-2 virus that causes COVID-19. Institutional protocols and algorithms that pertain to the evaluation of patients at risk for COVID-19 are in a state of rapid change based on information released by regulatory bodies including the CDC and federal and state organizations. These policies and algorithms were followed during the patient's care in the ED.   Return precautions were discussed with patient/family who states their understanding.  At the time of discharge patient/daughter denied any unaddressed complaints or concerns.  Patient is agreeable for discharge home.  Note: Portions of this report may have been transcribed using voice recognition software. Every effort was made to ensure accuracy; however, inadvertent computerized transcription errors may be present   Final Clinical Impression(s) / ED Diagnoses Final diagnoses:  Atypical chest pain  Cough    Rx / DC Orders ED  Discharge Orders         Ordered    predniSONE (DELTASONE) 20 MG tablet  Daily        04/07/20 2227    lidocaine (LIDODERM) 5 %  Every 24 hours        04/07/20 2228           Cristina Gong, PA-C 04/08/20 0003    Tegeler, Canary Brim, MD 04/08/20 1556

## 2022-01-15 IMAGING — CT CT ANGIO CHEST
2 of 8 series · 19 of 36 positions shown · IV contrast (Omnipaque)
Comparison: 06/25/2017

CLINICAL DATA: Left chest pain

EXAM:
CT ANGIOGRAPHY CHEST WITH CONTRAST
TECHNIQUE: Multidetector CT imaging of the chest was performed using the
standard protocol during bolus administration of intravenous
contrast. Multiplanar CT image reconstructions and MIPs were
obtained to evaluate the vascular anatomy.
CONTRAST:  75mL OMNIPAQUE IOHEXOL 350 MG/ML SOLN

[Series 6: pe thins · axial · 0.71mm/px · z∈[-168,+122]mm · 18 of 326 slices shown]
[im 18/326  lung]
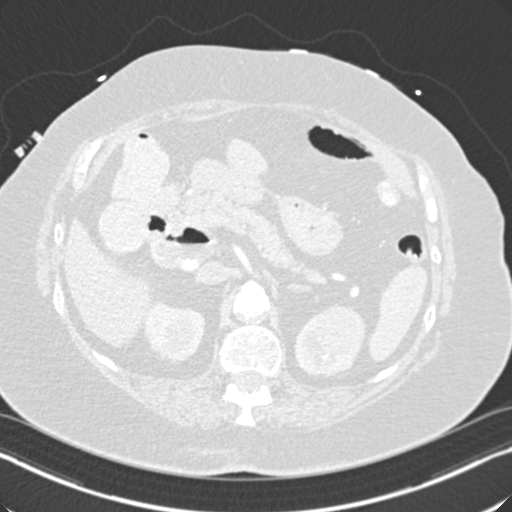
[im 35/326  mediastinal]
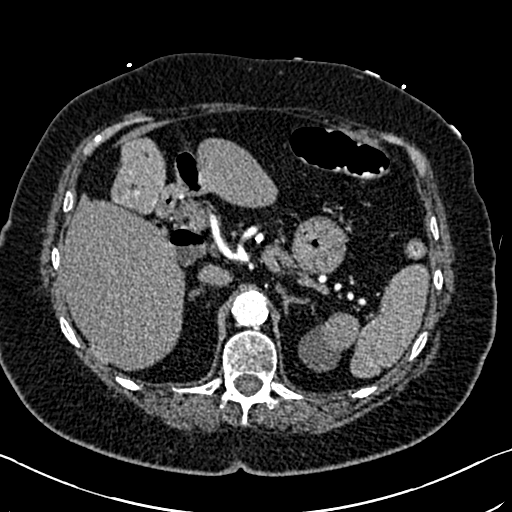
[im 52/326  lung]
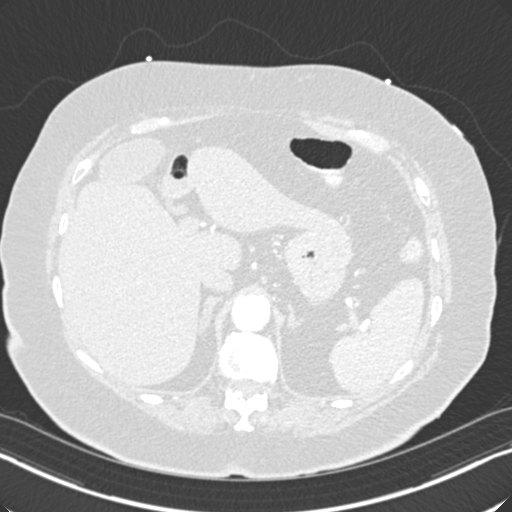
[im 69/326  mediastinal]
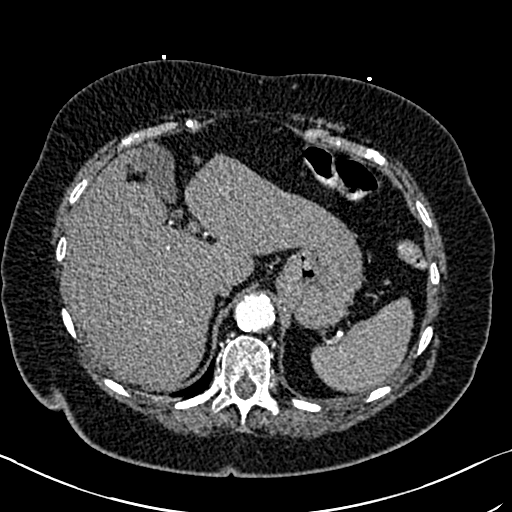
[im 86/326  lung]
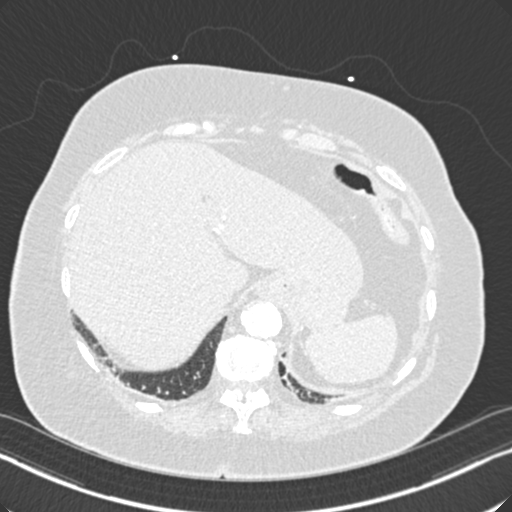
[im 103/326  mediastinal]
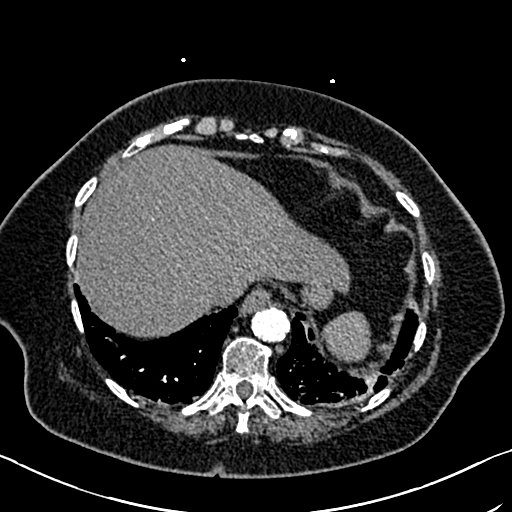
[im 120/326  lung]
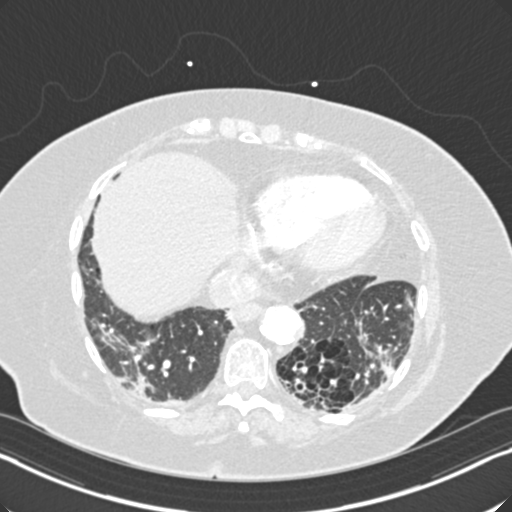
[im 137/326  mediastinal]
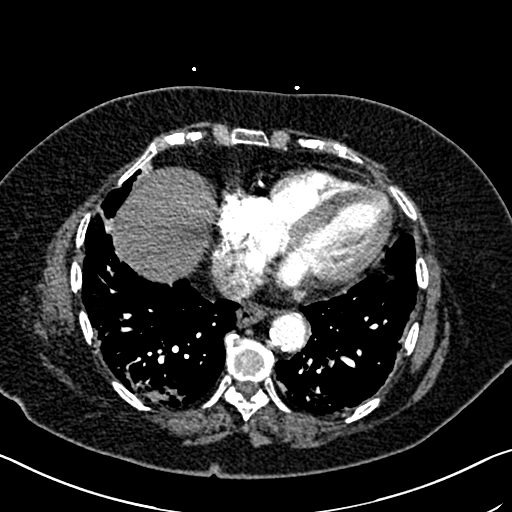
[im 154/326  lung]
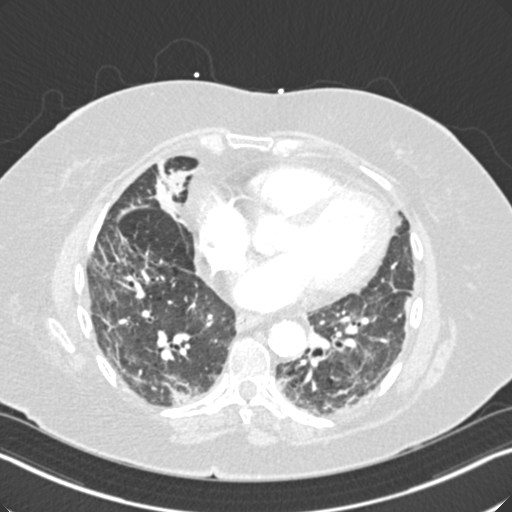
[im 172/326  mediastinal]
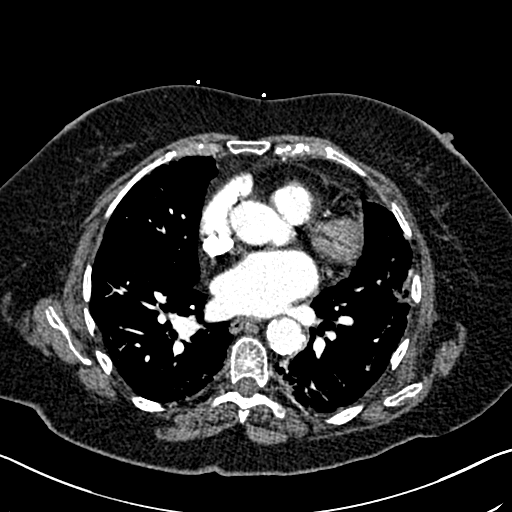
[im 189/326  lung]
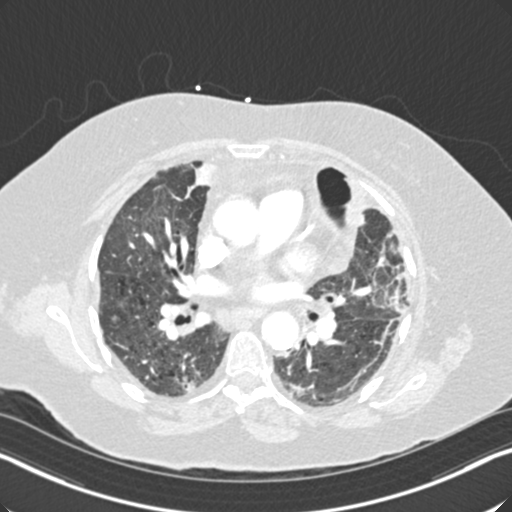
[im 206/326  mediastinal]
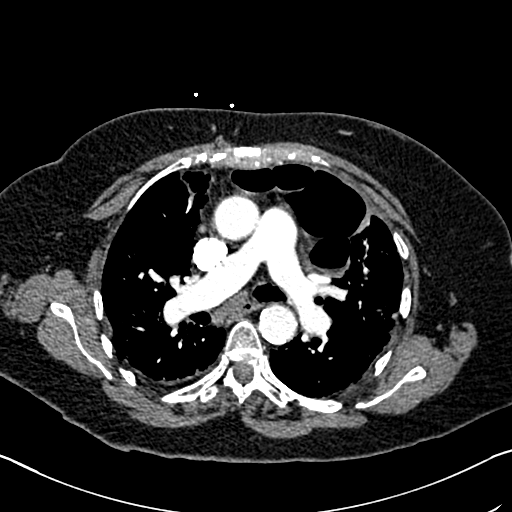
[im 223/326  lung]
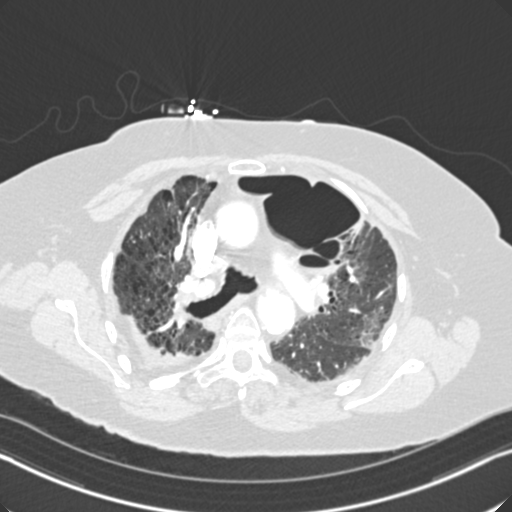
[im 240/326  mediastinal]
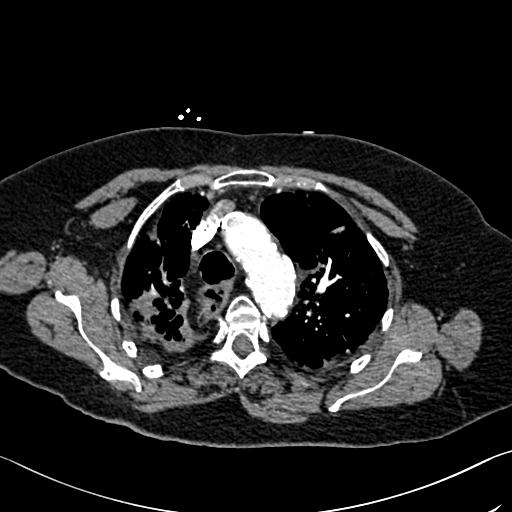
[im 257/326  lung]
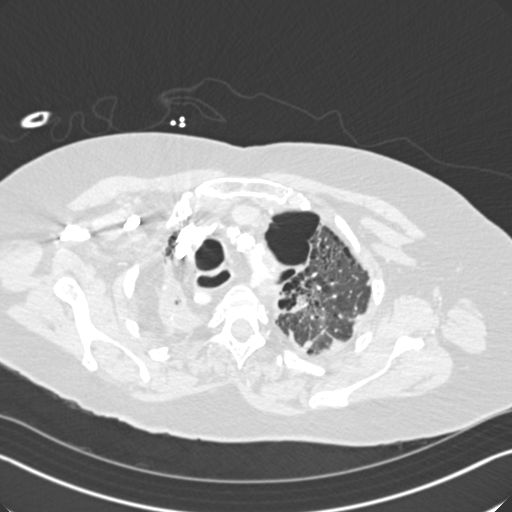
[im 274/326  mediastinal]
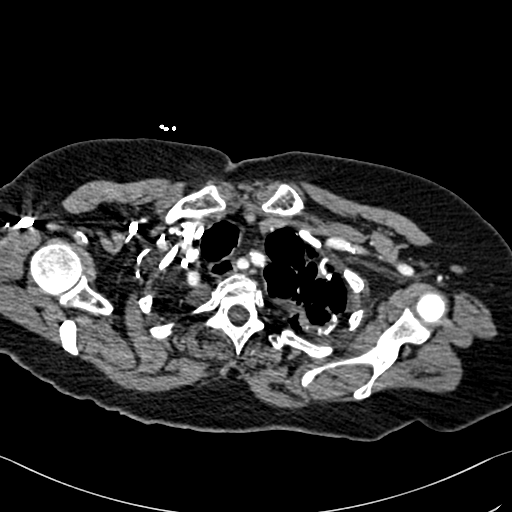
[im 291/326  lung]
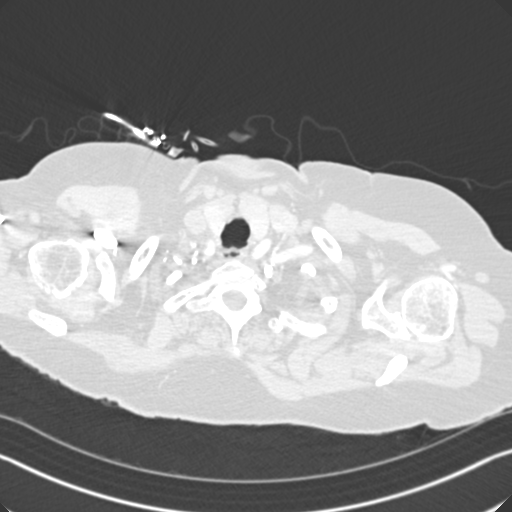
[im 308/326  mediastinal]
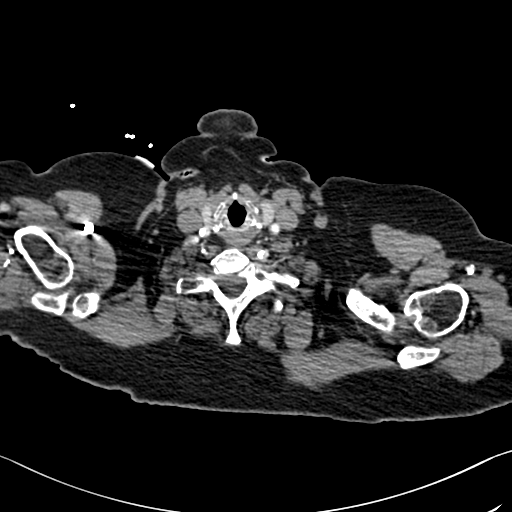

[Series 7: pe coronal mpr · coronal · 0.66mm/px · 1 of 155 slices shown]
[im 78/155  mediastinal]
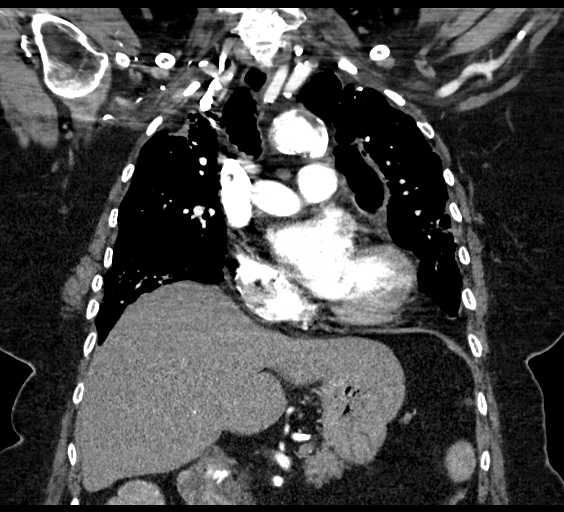

[19 of 36 positions shown; findings below may reference images not displayed]

FINDINGS: Cardiovascular: Severe diffuse coronary artery disease and aortic
atherosclerosis. Cardiomegaly. No evidence of aortic aneurysm.
Retroesophageal right subclavian artery. No filling defects in the
pulmonary arteries to suggest pulmonary emboli.

Mediastinum/Nodes: No mediastinal, hilar, or axillary adenopathy.
Trachea and esophagus are unremarkable. Thyroid unremarkable.

Lungs/Pleura: Severe bullous emphysema. Large bulla noted in the
anteromedial left upper lobe. Biapical scarring. Areas of scarring
throughout the lungs bilaterally. Bronchiectasis in the left lower
lobe. No definite acute process. No effusions.

Upper Abdomen: Imaging into the upper abdomen demonstrates no acute
findings.

Musculoskeletal: Chest wall soft tissues are unremarkable. No acute
bony abnormality.

Review of the MIP images confirms the above findings.
IMPRESSION: Severe bullous emphysematous changes and areas of scarring
throughout the lungs, most pronounced in the apices. No definite
acute process.

No evidence of pulmonary embolus.

Diffuse coronary artery disease.

Aortic Atherosclerosis (XO3GX-VS2.2) and Emphysema (XO3GX-LYH.5).

## 2023-08-16 DEATH — deceased
